# Patient Record
Sex: Female | Born: 2000 | Race: White | Hispanic: No | Marital: Single | Smoking: Never smoker
Health system: Southern US, Community
[De-identification: ages and names within clinical notes are randomized; demographics above are authoritative.]

---
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
## 2018-01-08 ENCOUNTER — Telehealth (INDEPENDENT_AMBULATORY_CARE_PROVIDER_SITE_OTHER): Payer: Self-pay | Admitting: Orthopaedic Surgery

## 2018-01-08 NOTE — Telephone Encounter (Signed)
Patient's mother called stating that her daughter is going to concert on Wednesday and the tickets are in the handicap section and wanted to know if Dr. Roda ShuttersXu would be willing to write a note stating that she is not able to stand for very long.  CB#[phone].  Thank you.

## 2018-01-11 ENCOUNTER — Encounter (INDEPENDENT_AMBULATORY_CARE_PROVIDER_SITE_OTHER): Payer: Self-pay

## 2018-01-11 NOTE — Telephone Encounter (Signed)
Letter ready for pick up at the front desk. Patients mom aware.

## 2018-01-11 NOTE — Telephone Encounter (Signed)
See message below °

## 2018-01-11 NOTE — Telephone Encounter (Signed)
sure

## 2018-01-12 IMAGING — RF DG FEMUR 2+V*R*
1 series · 6 of 6 positions shown · non-contrast
Comparison: Right femur radiographs performed earlier today at [DATE]
p.m.

CLINICAL DATA: Right femoral intramedullary nail placement. Initial
encounter.

EXAM:
RIGHT FEMUR 2 VIEWS

[Series 1: run · 6 of 6 slices shown]
[im 1/6]
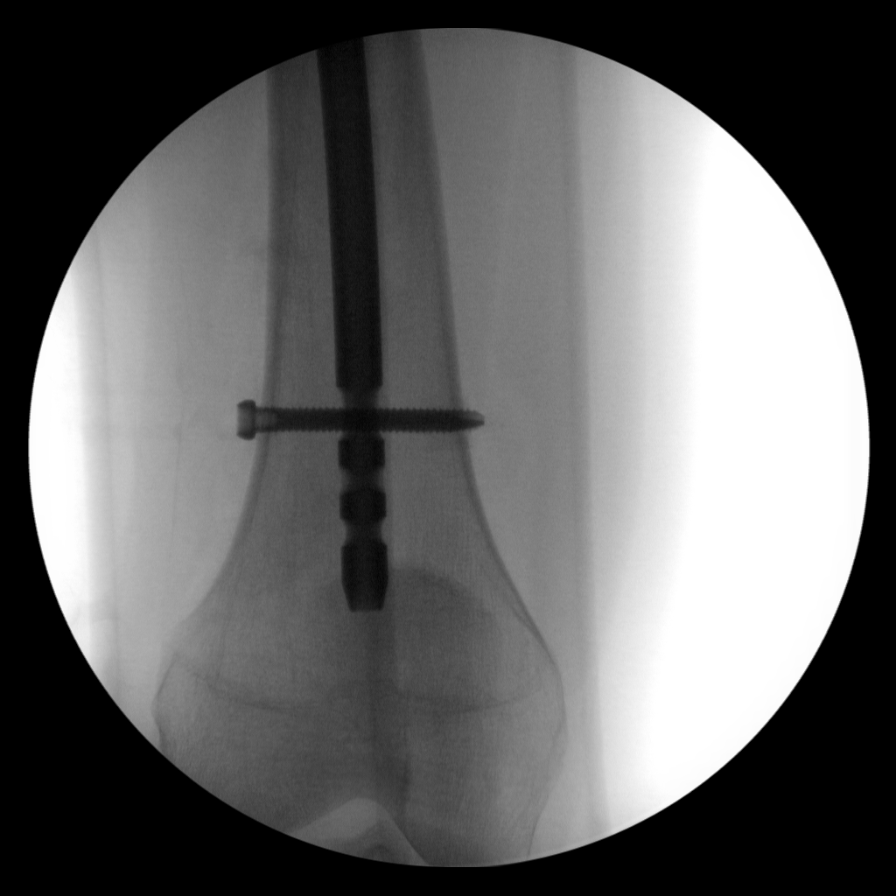
[im 2/6]
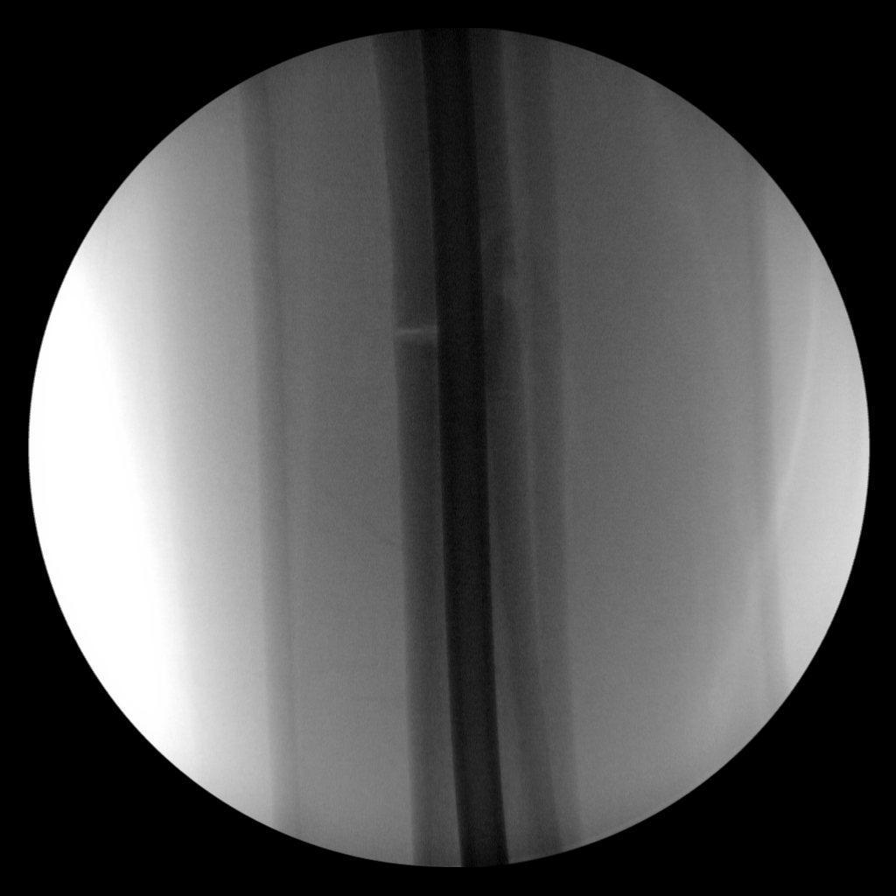
[im 3/6]
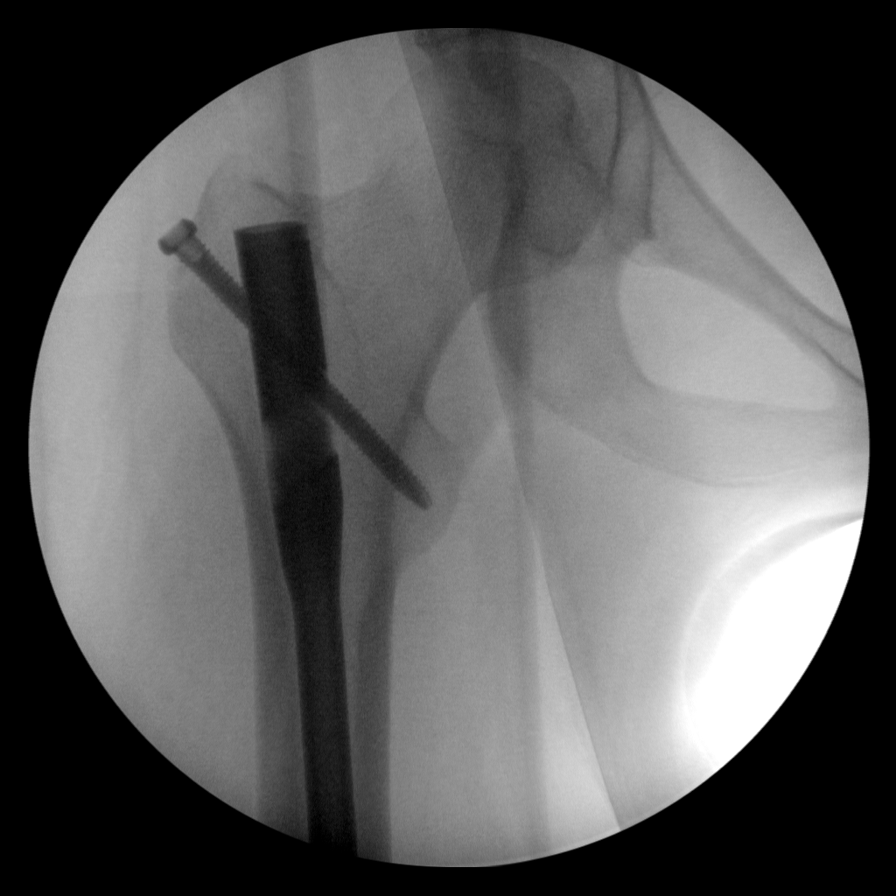
[im 4/6]
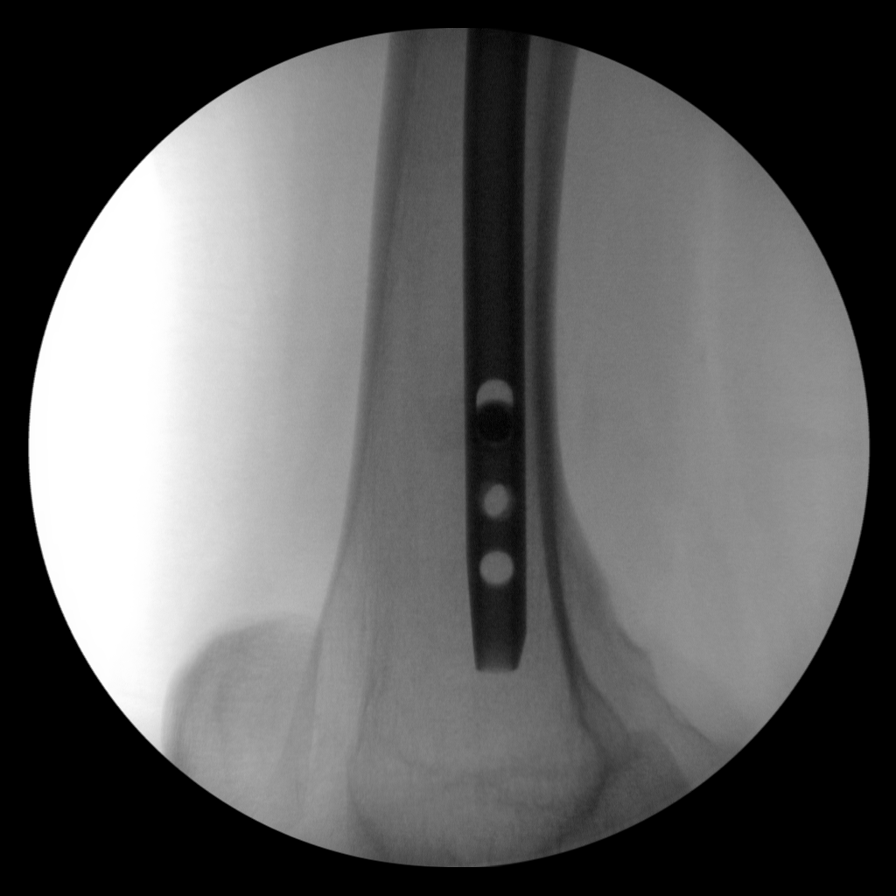
[im 5/6]
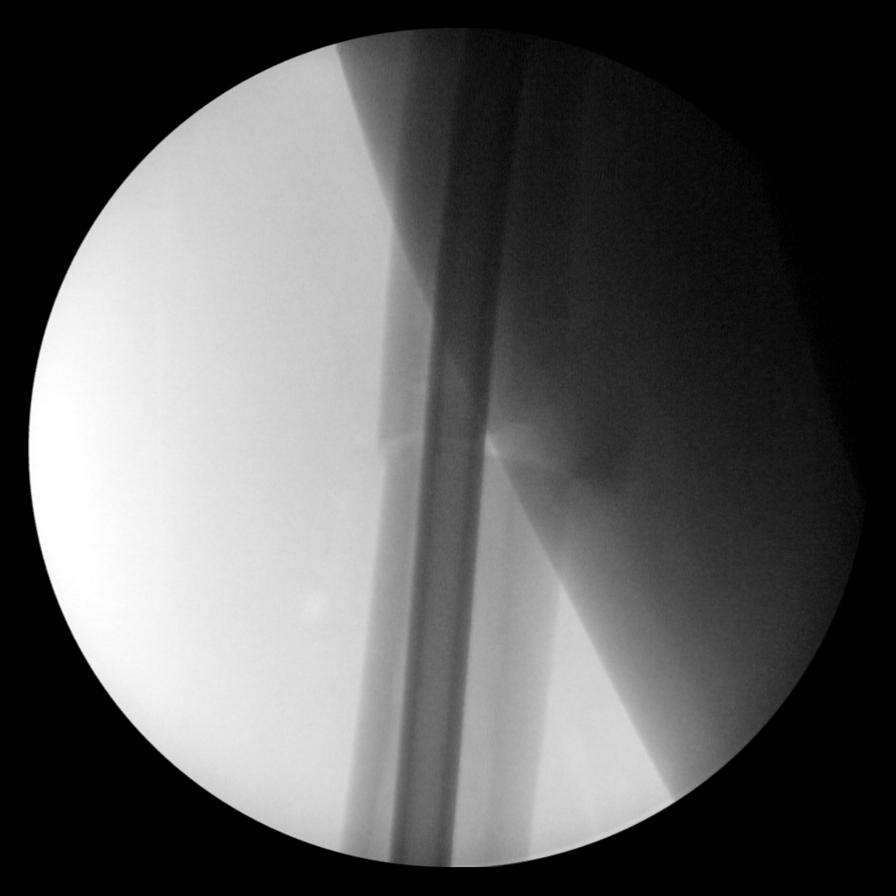
[im 6/6]
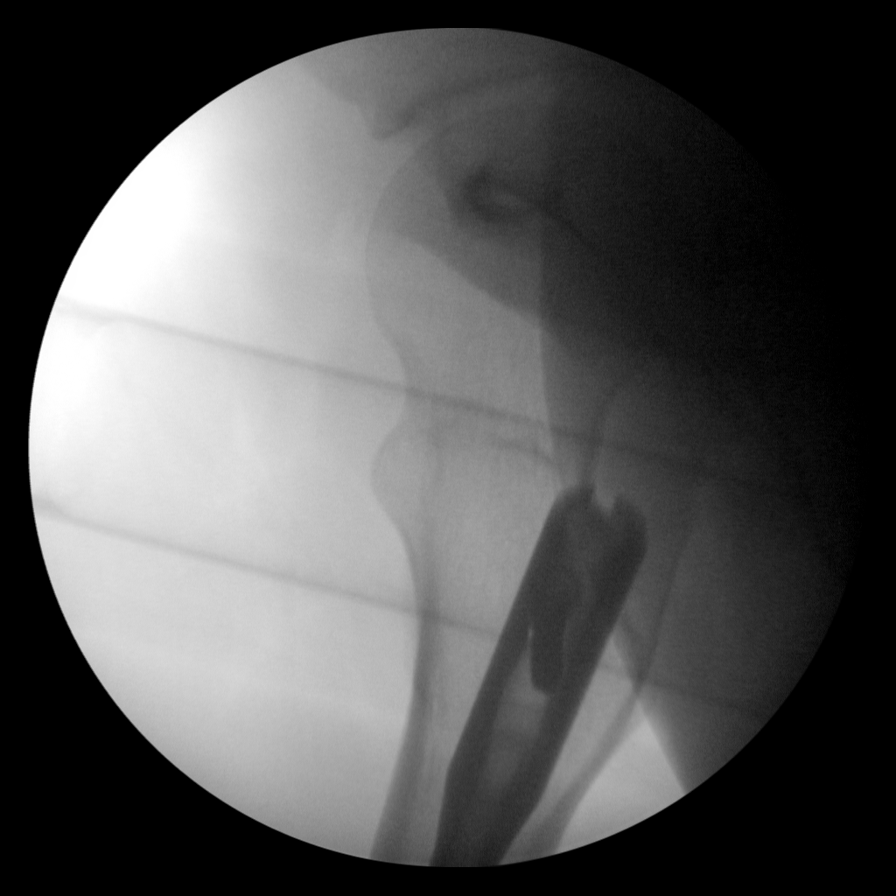

[6 of 6 positions shown; findings below may reference images not displayed]

FINDINGS: Six fluoroscopic C-arm images are provided from the OR,
demonstrating placement of an intramedullary nail and screws,
transfixing the right femoral fracture in grossly anatomic
alignment. No new fractures are seen. A small butterfly fragment is
noted. The right femoral head remains seated at the acetabulum.
IMPRESSION: Status post internal fixation of right femoral fracture in grossly
anatomic alignment.

## 2018-02-01 ENCOUNTER — Ambulatory Visit (INDEPENDENT_AMBULATORY_CARE_PROVIDER_SITE_OTHER): Payer: Medicaid Other | Admitting: Orthopaedic Surgery

## 2018-02-01 ENCOUNTER — Encounter (INDEPENDENT_AMBULATORY_CARE_PROVIDER_SITE_OTHER): Payer: Self-pay | Admitting: Orthopaedic Surgery

## 2018-02-01 ENCOUNTER — Ambulatory Visit (INDEPENDENT_AMBULATORY_CARE_PROVIDER_SITE_OTHER): Payer: Medicaid Other

## 2018-02-01 DIAGNOSIS — S72321D Displaced transverse fracture of shaft of right femur, subsequent encounter for closed fracture with routine healing: Secondary | ICD-10-CM

## 2018-02-01 NOTE — Progress Notes (Signed)
   Office Visit Note   Patient: Cindy Torres           Date of Birth: [birth year]           MRN: [id number] Visit Date: 02/01/2018              Requested by: No referring provider defined for this encounter. PCP: Patient, No Pcp Per   Assessment & Plan: Visit Diagnoses:  1. Closed displaced transverse fracture of shaft of right femur with routine healing, subsequent encounter     Plan: At this point patient has demonstrated clinical and radiographic evidence of healing.  I am going to go ahead and release her from our care.  Follow-up as needed.  Follow-Up Instructions: Return if symptoms worsen or fail to improve.   Orders:  Orders Placed This Encounter  Procedures  . XR FEMUR, MIN 2 VIEWS RIGHT   No orders of the defined types were placed in this encounter.     Procedures: No procedures performed   Clinical Data: No additional findings.   Subjective: Chief Complaint  Patient presents with  . Right Leg - Follow-up    Patient is 6 months status post intramedullary fixation of midshaft femur fracture.  She is doing well.  She occasionally has some discomfort with cold weather but she has resumed all of her normal activities.    Review of Systems   Objective: Vital Signs: There were no vitals taken for this visit.  Physical Exam  Ortho Exam Exam is benign.  Fully healed surgical scars.  No swelling. Specialty Comments:  No specialty comments available.  Imaging: Xr Femur, Min 2 Views Right  Result Date: 02/01/2018 Abundant callus formation consistent with radiographic evidence of healing    PMFS History: Patient Active Problem List   Diagnosis Date Noted  . Right femoral shaft fracture ([facility]) 08/08/2017   History reviewed. No pertinent past medical history.  History reviewed. No pertinent family history.  Past Surgical History:  Procedure Laterality Date  . FEMUR IM NAIL Right 08/07/2017   Procedure: INTRAMEDULLARY (IM) NAIL FEMORAL;   Surgeon: Tarry KosXu, Raj Landress M, MD;  Location: [facility];  Service: Orthopedics;  Laterality: Right;   Social History   Occupational History  . Not on file  Tobacco Use  . Smoking status: Never Smoker  . Smokeless tobacco: Never Used  Substance and Sexual Activity  . Alcohol use: No  . Drug use: No  . Sexual activity: Not on file

## 2018-06-03 ENCOUNTER — Telehealth (INDEPENDENT_AMBULATORY_CARE_PROVIDER_SITE_OTHER): Payer: Self-pay | Admitting: Orthopaedic Surgery

## 2018-06-03 NOTE — Telephone Encounter (Signed)
Patient's mother called back and advised her of Dr. Warren DanesXu's message.  They are going to watch it over the weekend and if it is not better they will call the office to schedule an appointment.  I advised patient's mother that Dr. Roda ShuttersXu is not here on Monday's due to surgery.

## 2018-06-03 NOTE — Telephone Encounter (Signed)
If it doesn't get better over the weekend, then she should come in for appt.  If she's still able to ambulate without much difficulty, then it's ok to watch it for now.

## 2018-06-03 NOTE — Telephone Encounter (Signed)
Patients mother called on behalf of patient stating that her daughter slipped and fell on her leg that was recently operated on back in Jan for a broken femur, she said she can feel the rod moving and she in some pain, the mother was wondering if she should be seen? CB # [phone]

## 2018-06-03 NOTE — Telephone Encounter (Signed)
See message.

## 2023-01-22 DIAGNOSIS — Z419 Encounter for procedure for purposes other than remedying health state, unspecified: Secondary | ICD-10-CM | POA: Diagnosis not present

## 2023-02-20 DIAGNOSIS — Z419 Encounter for procedure for purposes other than remedying health state, unspecified: Secondary | ICD-10-CM | POA: Diagnosis not present

## 2023-03-23 DIAGNOSIS — Z419 Encounter for procedure for purposes other than remedying health state, unspecified: Secondary | ICD-10-CM | POA: Diagnosis not present

## 2023-04-22 DIAGNOSIS — Z419 Encounter for procedure for purposes other than remedying health state, unspecified: Secondary | ICD-10-CM | POA: Diagnosis not present

## 2023-05-23 DIAGNOSIS — Z419 Encounter for procedure for purposes other than remedying health state, unspecified: Secondary | ICD-10-CM | POA: Diagnosis not present

## 2023-06-22 DIAGNOSIS — Z419 Encounter for procedure for purposes other than remedying health state, unspecified: Secondary | ICD-10-CM | POA: Diagnosis not present

## 2023-07-23 DIAGNOSIS — Z419 Encounter for procedure for purposes other than remedying health state, unspecified: Secondary | ICD-10-CM | POA: Diagnosis not present

## 2023-08-23 DIAGNOSIS — Z419 Encounter for procedure for purposes other than remedying health state, unspecified: Secondary | ICD-10-CM | POA: Diagnosis not present

## 2023-09-22 DIAGNOSIS — Z419 Encounter for procedure for purposes other than remedying health state, unspecified: Secondary | ICD-10-CM | POA: Diagnosis not present

## 2023-10-23 DIAGNOSIS — Z419 Encounter for procedure for purposes other than remedying health state, unspecified: Secondary | ICD-10-CM | POA: Diagnosis not present

## 2023-11-22 DIAGNOSIS — Z419 Encounter for procedure for purposes other than remedying health state, unspecified: Secondary | ICD-10-CM | POA: Diagnosis not present

## 2023-12-23 DIAGNOSIS — Z419 Encounter for procedure for purposes other than remedying health state, unspecified: Secondary | ICD-10-CM | POA: Diagnosis not present

## 2024-01-23 DIAGNOSIS — Z419 Encounter for procedure for purposes other than remedying health state, unspecified: Secondary | ICD-10-CM | POA: Diagnosis not present

## 2024-02-20 DIAGNOSIS — Z419 Encounter for procedure for purposes other than remedying health state, unspecified: Secondary | ICD-10-CM | POA: Diagnosis not present

## 2024-04-02 DIAGNOSIS — Z419 Encounter for procedure for purposes other than remedying health state, unspecified: Secondary | ICD-10-CM | POA: Diagnosis not present

## 2024-05-02 DIAGNOSIS — Z419 Encounter for procedure for purposes other than remedying health state, unspecified: Secondary | ICD-10-CM | POA: Diagnosis not present

## 2024-06-02 DIAGNOSIS — Z419 Encounter for procedure for purposes other than remedying health state, unspecified: Secondary | ICD-10-CM | POA: Diagnosis not present

## 2024-07-02 DIAGNOSIS — Z419 Encounter for procedure for purposes other than remedying health state, unspecified: Secondary | ICD-10-CM | POA: Diagnosis not present

## 2024-08-02 DIAGNOSIS — Z419 Encounter for procedure for purposes other than remedying health state, unspecified: Secondary | ICD-10-CM | POA: Diagnosis not present

## 2024-09-02 DIAGNOSIS — Z419 Encounter for procedure for purposes other than remedying health state, unspecified: Secondary | ICD-10-CM | POA: Diagnosis not present

## 2024-11-02 DIAGNOSIS — Z419 Encounter for procedure for purposes other than remedying health state, unspecified: Secondary | ICD-10-CM | POA: Diagnosis not present
